# Patient Record
Sex: Female | Born: 1935 | Race: White | Hispanic: No | Marital: Single | Smoking: Never smoker
Health system: Southern US, Community
[De-identification: ages and names within clinical notes are randomized; demographics above are authoritative.]

## PROBLEM LIST (undated history)

## (undated) DIAGNOSIS — F0390 Unspecified dementia without behavioral disturbance: Secondary | ICD-10-CM

## (undated) DIAGNOSIS — F419 Anxiety disorder, unspecified: Secondary | ICD-10-CM

## (undated) DIAGNOSIS — I10 Essential (primary) hypertension: Secondary | ICD-10-CM

---

## 2015-06-28 ENCOUNTER — Inpatient Hospital Stay
Admission: EM | Admit: 2015-06-28 | Discharge: 2015-06-30 | DRG: 689 | Disposition: A | Discharge status: Skilled Nursing Facility | Payer: Commercial Managed Care - HMO | Attending: Internal Medicine | Admitting: Internal Medicine

## 2015-06-28 DIAGNOSIS — R296 Repeated falls: Secondary | ICD-10-CM | POA: Diagnosis present

## 2015-06-28 DIAGNOSIS — R410 Disorientation, unspecified: Secondary | ICD-10-CM

## 2015-06-28 DIAGNOSIS — G9340 Encephalopathy, unspecified: Secondary | ICD-10-CM | POA: Diagnosis present

## 2015-06-28 DIAGNOSIS — N390 Urinary tract infection, site not specified: Principal | ICD-10-CM | POA: Diagnosis present

## 2015-06-28 DIAGNOSIS — Z79899 Other long term (current) drug therapy: Secondary | ICD-10-CM

## 2015-06-28 DIAGNOSIS — N3000 Acute cystitis without hematuria: Secondary | ICD-10-CM

## 2015-06-28 DIAGNOSIS — W19XXXA Unspecified fall, initial encounter: Secondary | ICD-10-CM | POA: Diagnosis present

## 2015-06-28 DIAGNOSIS — R451 Restlessness and agitation: Secondary | ICD-10-CM

## 2015-06-28 DIAGNOSIS — Z66 Do not resuscitate: Secondary | ICD-10-CM | POA: Diagnosis present

## 2015-06-28 DIAGNOSIS — I10 Essential (primary) hypertension: Secondary | ICD-10-CM | POA: Diagnosis present

## 2015-06-28 DIAGNOSIS — F419 Anxiety disorder, unspecified: Secondary | ICD-10-CM | POA: Diagnosis present

## 2015-06-28 HISTORY — DX: Essential (primary) hypertension: I10

## 2015-06-28 HISTORY — DX: Anxiety disorder, unspecified: F41.9

## 2015-06-28 LAB — GLUCOSE, CAPILLARY: Glucose-Capillary: 95 mg/dL (ref 65–99)
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
## 2015-06-29 ENCOUNTER — Encounter: Payer: Self-pay | Admitting: Internal Medicine

## 2015-06-29 ENCOUNTER — Emergency Department: Payer: Commercial Managed Care - HMO

## 2015-06-29 DIAGNOSIS — Z79899 Other long term (current) drug therapy: Secondary | ICD-10-CM | POA: Diagnosis not present

## 2015-06-29 DIAGNOSIS — R296 Repeated falls: Secondary | ICD-10-CM | POA: Diagnosis present

## 2015-06-29 DIAGNOSIS — N390 Urinary tract infection, site not specified: Secondary | ICD-10-CM | POA: Diagnosis present

## 2015-06-29 DIAGNOSIS — F419 Anxiety disorder, unspecified: Secondary | ICD-10-CM | POA: Diagnosis present

## 2015-06-29 DIAGNOSIS — I10 Essential (primary) hypertension: Secondary | ICD-10-CM | POA: Diagnosis present

## 2015-06-29 DIAGNOSIS — G9340 Encephalopathy, unspecified: Secondary | ICD-10-CM | POA: Diagnosis present

## 2015-06-29 DIAGNOSIS — W19XXXA Unspecified fall, initial encounter: Secondary | ICD-10-CM | POA: Diagnosis present

## 2015-06-29 DIAGNOSIS — R410 Disorientation, unspecified: Secondary | ICD-10-CM | POA: Diagnosis present

## 2015-06-29 DIAGNOSIS — Z66 Do not resuscitate: Secondary | ICD-10-CM | POA: Diagnosis present

## 2015-06-29 LAB — URINALYSIS COMPLETE WITH MICROSCOPIC (ARMC ONLY)
Bilirubin Urine: NEGATIVE
Glucose, UA: NEGATIVE mg/dL
Hgb urine dipstick: NEGATIVE
Ketones, ur: NEGATIVE mg/dL
Leukocytes, UA: NEGATIVE
Nitrite: NEGATIVE
PH: 8 (ref 5.0–8.0)
PROTEIN: NEGATIVE mg/dL
SQUAMOUS EPITHELIAL / LPF: NONE SEEN
Specific Gravity, Urine: 1.01 (ref 1.005–1.030)

## 2015-06-29 LAB — BASIC METABOLIC PANEL
Anion gap: 6 (ref 5–15)
BUN: 13 mg/dL (ref 6–20)
CHLORIDE: 100 mmol/L — AB (ref 101–111)
CO2: 29 mmol/L (ref 22–32)
CREATININE: 0.85 mg/dL (ref 0.44–1.00)
Calcium: 8.8 mg/dL — ABNORMAL LOW (ref 8.9–10.3)
GFR calc Af Amer: >60 mL/min (ref >60)
GFR calc non Af Amer: 56 mL/min — ABNORMAL LOW (ref >60)
GLUCOSE: 109 mg/dL — AB (ref 65–99)
Potassium: 3.5 mmol/L (ref 3.5–5.1)
SODIUM: 135 mmol/L (ref 135–145)

## 2015-06-29 LAB — CBC
HCT: 34.9 % — ABNORMAL LOW (ref 35.0–47.0)
HCT: 39.4 % (ref 35.0–47.0)
HEMOGLOBIN: 12.8 g/dL (ref 12.0–16.0)
Hemoglobin: 11.7 g/dL — ABNORMAL LOW (ref 12.0–16.0)
MCH: 27.4 pg (ref 26.0–34.0)
MCH: 28.5 pg (ref 26.0–34.0)
MCHC: 32.3 g/dL (ref 32.0–36.0)
MCHC: 33.5 g/dL (ref 32.0–36.0)
MCV: 84.8 fL (ref 80.0–100.0)
MCV: 84.8 fL (ref 80.0–100.0)
PLATELETS: 230 10*3/uL (ref 150–440)
Platelets: 294 10*3/uL (ref 150–440)
RBC: 4.12 MIL/uL (ref 3.80–5.20)
RBC: 4.65 MIL/uL (ref 3.80–5.20)
RDW: 13.6 % (ref 11.5–14.5)
RDW: 13.8 % (ref 11.5–14.5)
WBC: 13.3 10*3/uL — ABNORMAL HIGH (ref 3.6–11.0)
WBC: 7.9 10*3/uL (ref 3.6–11.0)

## 2015-06-29 LAB — COMPREHENSIVE METABOLIC PANEL
ALBUMIN: 4.7 g/dL (ref 3.5–5.0)
ALT: 11 U/L — AB (ref 14–54)
AST: 33 U/L (ref 15–41)
Alkaline Phosphatase: 71 U/L (ref 38–126)
Anion gap: 8 (ref 5–15)
BUN: 15 mg/dL (ref 6–20)
CHLORIDE: 100 mmol/L — AB (ref 101–111)
CO2: 26 mmol/L (ref 22–32)
CREATININE: 0.75 mg/dL (ref 0.44–1.00)
Calcium: 9.4 mg/dL (ref 8.9–10.3)
GFR calc non Af Amer: >60 mL/min (ref >60)
Glucose, Bld: 115 mg/dL — ABNORMAL HIGH (ref 65–99)
Potassium: 4.1 mmol/L (ref 3.5–5.1)
SODIUM: 134 mmol/L — AB (ref 135–145)
Total Bilirubin: 0.7 mg/dL (ref 0.3–1.2)
Total Protein: 8 g/dL (ref 6.5–8.1)

## 2015-06-29 LAB — AMMONIA: Ammonia: 20 umol/L (ref 9–35)

## 2015-06-29 LAB — LACTIC ACID, PLASMA
LACTIC ACID, VENOUS: 1.2 mmol/L (ref 0.5–2.0)
Lactic Acid, Venous: 2 mmol/L (ref 0.5–2.0)

## 2015-06-29 MED ORDER — DEXTROSE 5 % IV SOLN
1.0000 g | INTRAVENOUS | Status: DC
  Administered 2015-06-30: 1 g via INTRAVENOUS
  Filled 2015-06-29 (×2): qty 10

## 2015-06-29 MED ORDER — SODIUM CHLORIDE 0.9 % IV SOLN
INTRAVENOUS | Status: AC
  Administered 2015-06-29: 05:00:00 via INTRAVENOUS

## 2015-06-29 MED ORDER — ENOXAPARIN SODIUM 30 MG/0.3ML ~~LOC~~ SOLN
30.0000 mg | SUBCUTANEOUS | Status: DC
Start: 2015-06-29 — End: 2015-06-30
  Administered 2015-06-30: 30 mg via SUBCUTANEOUS
  Filled 2015-06-29: qty 0.3

## 2015-06-29 MED ORDER — LORAZEPAM 2 MG/ML IJ SOLN
INTRAMUSCULAR | Status: AC
  Administered 2015-06-29: 2 mg
  Filled 2015-06-29: qty 1

## 2015-06-29 MED ORDER — SODIUM CHLORIDE 0.9 % IJ SOLN
3.0000 mL | Freq: Two times a day (BID) | INTRAMUSCULAR | Status: DC
  Administered 2015-06-30: 3 mL via INTRAVENOUS

## 2015-06-29 MED ORDER — HALOPERIDOL LACTATE 5 MG/ML IJ SOLN
INTRAMUSCULAR | Status: AC
  Administered 2015-06-29: 5 mg
  Filled 2015-06-29: qty 1

## 2015-06-29 MED ORDER — ACETAMINOPHEN 650 MG RE SUPP: 650.0000 mg | Freq: Four times a day (QID) | RECTAL | Status: DC | PRN

## 2015-06-29 MED ORDER — DEXTROSE 5 % IV SOLN
1.0000 g | Freq: Once | INTRAVENOUS | Status: AC
  Administered 2015-06-29: 1 g via INTRAVENOUS
  Filled 2015-06-29: qty 10

## 2015-06-29 MED ORDER — ONDANSETRON HCL 4 MG PO TABS: 4.0000 mg | ORAL_TABLET | Freq: Four times a day (QID) | ORAL | Status: DC | PRN

## 2015-06-29 MED ORDER — ONDANSETRON HCL 4 MG/2ML IJ SOLN: 4.0000 mg | Freq: Four times a day (QID) | INTRAMUSCULAR | Status: DC | PRN

## 2015-06-29 MED ORDER — ACETAMINOPHEN 325 MG PO TABS: 650.0000 mg | ORAL_TABLET | Freq: Four times a day (QID) | ORAL | Status: DC | PRN

## 2015-06-29 NOTE — Progress Notes (Signed)
[facility]   PATIENT NAME: Debra Peck    MR#:  [id number]  DATE OF BIRTH:  1916-11-17  SUBJECTIVE:  Son at bedside. Patient is currently lethargic. She is not arousable. Vital signs stable. Opens eyes to verbal commands temporarily  REVIEW OF SYSTEMS:   Review of Systems  Unable to perform ROS: mental acuity   Tolerating Diet: No Tolerating PT: Pending  DRUG ALLERGIES:  No Known Allergies  VITALS:  Blood pressure 125/45, pulse 61, temperature 97.9 F (36.6 C), temperature source Axillary, resp. rate 18, weight 96 lb 11.2 oz (43.863 kg), SpO2 100 %.  PHYSICAL EXAMINATION:   Physical Exam  GENERAL:  81 y.o.-year-old patient lying in the bed with no acute distress.  EYES: Pupils equal, round, reactive to light and accommodation. No scleral icterus. Extraocular muscles intact.  HEENT: Head atraumatic, normocephalic. Oropharynx and nasopharynx clear.  NECK:  Supple, no jugular venous distention. No thyroid enlargement, no tenderness.  LUNGS: Normal breath sounds bilaterally, no wheezing, rales, rhonchi. No use of accessory muscles of respiration.  CARDIOVASCULAR: S1, S2 normal. No murmurs, rubs, or gallops.  ABDOMEN: Soft, nontender, nondistended. Bowel sounds present. No organomegaly or mass.  EXTREMITIES: No cyanosis, clubbing or edema b/l.    NEUROLOGIC: Unable to assess patient lethargic Mohs all extremities well spontaneously  PSYCHIATRIC: Lethargic  SKIN: No obvious rash, lesion, or ulcer.    LABORATORY PANEL:   CBC  Recent Labs Lab 06/29/15 0459  WBC 7.9  HGB 11.7*  HCT 34.9*  PLT 230    Chemistries   Recent Labs Lab 06/29/15 0015 06/29/15 0459  NA 134* 135  K 4.1 3.5  CL 100* 100*  CO2 26 29  GLUCOSE 115* 109*  BUN 15 13  CREATININE 0.75 0.85  CALCIUM 9.4 8.8*  AST 33  --   ALT 11*  --   ALKPHOS 71  --   BILITOT 0.7  --     Cardiac Enzymes No results for input(s): TROPONINI in the last  168 hours.  RADIOLOGY:  Dg Chest 1 View  06/29/2015  CLINICAL DATA:  Altered mental status.  Unable to obtain history. EXAM: CHEST 1 VIEW COMPARISON:  None. FINDINGS: Normal heart size and pulmonary vascularity. Calcified and tortuous aorta. Mediastinal contours appear intact. Emphysematous changes in the lungs. Fibrosis in the lung bases. Calcified granulomas in the right lung base. No focal airspace disease or consolidation. No blunting of costophrenic angles. No pneumothorax. IMPRESSION: Emphysematous changes and fibrosis in the lungs. No evidence of active pulmonary disease. Electronically Signed   By: Burman NievesWilliam  Stevens M.D.   On: 06/29/2015 01:17   Ct Head Wo Contrast  06/29/2015  CLINICAL DATA:  Found on bedroom floor. Unwitnessed fall. Concern for head injury. Initial encounter. EXAM: CT HEAD WITHOUT CONTRAST TECHNIQUE: Contiguous axial images were obtained from the base of the skull through the vertex without intravenous contrast. COMPARISON:  None. FINDINGS: There is no evidence of acute infarction, mass lesion, or intra- or extra-axial hemorrhage on CT. Prominence of the ventricles and sulci reflects mild to moderate cortical volume loss. Mild cerebellar atrophy is noted. Scattered periventricular and subcortical white matter change likely reflects small vessel ischemic microangiopathy. The brainstem and fourth ventricle are within normal limits. The basal ganglia are unremarkable in appearance. The cerebral hemispheres demonstrate grossly normal gray-white differentiation. No mass effect or midline shift is seen. There is no evidence of fracture; a densely calcified 2.1 cm mass at the right posterior fossa  may reflect a calcified meningioma. The orbits are within normal limits. The paranasal sinuses and mastoid air cells are well-aerated. No significant soft tissue abnormalities are seen. IMPRESSION: 1. No evidence of traumatic intracranial injury or fracture. 2. Mild to moderate cortical volume  loss and scattered small vessel ischemic microangiopathy. 3. Densely calcified 2.1 cm mass at the right posterior fossa may reflect a calcified meningioma. Electronically Signed   By: Roanna Raider M.D.   On: 06/29/2015 03:15     ASSESSMENT AND PLAN:  Debra Peck is a 81 year old Caucasian female with history of anxiety/paranoia, hypertension comes in from [facility] independently living after she was found confused.   1. Acute encephalopathy - son states that the patient has not been eating much lately. Exact etiology of her encephalopathy is unclear at this time.  -IV fluids empirically on IV Rocephin for possible UTI urine culture pending -Vitals are stable  2.  HTN (hypertension) - at goal here. Continue to monitor. Will not restart home meds at this time the patient is able to reliably take by mouth. If her blood pressure rises we can use IV when necessary antihypertensives.  3.  chronic Anxiety - was on Paxil by her PCP for the same, but was not taking it reliably are consistently for the patient's son. Per patient's son patient recently has been getting meropenem very paranoid. She stays in her room at the independent living facility and does not go down stress with the dining area to eat. She has been having a feeling somebody stealing stuff and so keeps herself in the room. She does not have a psychiatrist who she follows with. We'll have psych see patient in a.m.  4. Falls - unclear etiology. CT head negative, precluding a thought of any cerebral bleed.  5. discharge planning social worker consult PT consult   Case discussed with Care Management/Social Worker. Management plans discussed with the  family and they are in agreement.  CODE STATUS: DO NOT RESUSCITATE according to the son  DVT Prophylaxis:Lovenox  TOTAL TIME TAKING CARE OF THIS PATIENT: 30  minutes.  >50% time spent on counselling and coordination of care  POSSIBLE D/C IN  1-2  DAYS, DEPENDING ON CLINICAL  CONDITION.   Debra Peck M.D on 06/29/2015 at 2:14 PM  Between 7am to 6pm - Pager - [phone]  After 6pm go to [email or web address] - password [email or web address] [facility]  [facility]  Office  [phone]  CC: Primary care physician; [facility]

## 2015-06-29 NOTE — ED Notes (Signed)
Report given to [facility]. [facility] she is trying to locate an off unit telemetry box before transfer to the floor.
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
## 2015-06-29 NOTE — H&P (Signed)
[facility] [place] at [facility]   PATIENT NAME: Debra Peck    MR#:  [id number]  DATE OF BIRTH:  1916/12/23  DATE OF ADMISSION:  06/28/2015  PRIMARY CARE PHYSICIAN: [facility]   REQUESTING/REFERRING PHYSICIAN: Carollee Massed, M.D.  CHIEF COMPLAINT:   Chief Complaint  Patient presents with  . Altered Mental Status    HISTORY OF PRESENT ILLNESS:  Debra Peck  is a 81 y.o. female who presents with acute encephalopathy. Patient was found naked in her bedroom after her downstairs neighbor complained of large or pink. Ceiling. Patient had left water running over flying in her sink in her bathroom. She is unable to contribute to her history due to her altered mental state, and her son provides collateral information. He is present with her in the ED today. He states that the patient told him that she was waiting in the bathroom and then fell and was able to get back to her room. This information is questionable given the patient's confusion and altered mental state. Initial workup in the ED was largely unremarkable. Patient has very mild leukocytosis with a white count at 13, and UA did not look suspicious for UTI except for the presence of some bacteria, but this is an absence of leukocyte esterase or nitrites or white cells. CT head did not show any significant findings. Hospitalists were called for admission for acute encephalopathy.  PAST MEDICAL HISTORY:   Past Medical History  Diagnosis Date  . HTN (hypertension)   . Anxiety     PAST SURGICAL HISTORY:  No past surgical history on file.  SOCIAL HISTORY:   Social History  Substance Use Topics  . Smoking status: Never Smoker   . Smokeless tobacco: Not on file  . Alcohol Use: No    FAMILY HISTORY:  No family history on file.  DRUG ALLERGIES:  No Known Allergies  MEDICATIONS AT HOME:   Prior to Admission medications   Medication Sig Start Date End Date Taking? Authorizing Provider   amLODipine (NORVASC) 2.5 MG tablet Take 2.5 mg by mouth daily.   Yes Historical Provider, MD    REVIEW OF SYSTEMS:  Review of Systems  Constitutional: Negative for fever, chills, weight loss and malaise/fatigue.  HENT: Negative for ear pain, hearing loss and tinnitus.   Eyes: Negative for blurred vision, double vision, pain and redness.  Respiratory: Negative for cough, hemoptysis and shortness of breath.   Cardiovascular: Negative for chest pain, palpitations, orthopnea and leg swelling.  Gastrointestinal: Negative for nausea, vomiting, abdominal pain, diarrhea and constipation.  Genitourinary: Negative for dysuria, frequency and hematuria.  Musculoskeletal: Positive for falls. Negative for back pain, joint pain and neck pain.  Skin:       No acne, rash, or lesions  Neurological: Negative for dizziness, tremors, focal weakness and weakness.       Acute encephalopathy  Endo/Heme/Allergies: Negative for polydipsia. Does not bruise/bleed easily.  Psychiatric/Behavioral: Negative for depression. The patient is not nervous/anxious and does not have insomnia.      VITAL SIGNS:   Filed Vitals:   06/29/15 0130 06/29/15 0145 06/29/15 0200 06/29/15 0230  BP: 115/53  106/56 118/61  Pulse: 69 72 65 66  Temp:      TempSrc:      Resp:   18 16  SpO2: 98% 95% 98% 97%   Wt Readings from Last 3 Encounters:  No data found for Wt    PHYSICAL EXAMINATION:  Physical Exam  Vitals reviewed.  Constitutional: She is oriented to person, place, and time. She appears well-developed. No distress.  Very thin  HENT:  Head: Normocephalic and atraumatic.  Mouth/Throat: Oropharynx is clear and moist.  Eyes: Conjunctivae and EOM are normal. Pupils are equal, round, and reactive to light. No scleral icterus.  Neck: Normal range of motion. Neck supple. No JVD present. No thyromegaly present.  Cardiovascular: Normal rate, regular rhythm and intact distal pulses.  Exam reveals no gallop and no friction rub.    No murmur heard. Respiratory: Effort normal and breath sounds normal. No respiratory distress. She has no wheezes. She has no rales.  GI: Soft. Bowel sounds are normal. She exhibits no distension. There is no tenderness.  Musculoskeletal: Normal range of motion. She exhibits no edema.  No arthritis, no gout  Lymphadenopathy:    She has no cervical adenopathy.  Neurological: She is alert and oriented to person, place, and time. No cranial nerve deficit.  No dysarthria, no aphasia  Skin: Skin is warm and dry. No rash noted. No erythema.  Psychiatric: She has a normal mood and affect. Her behavior is normal. Judgment and thought content normal.    LABORATORY PANEL:   CBC  Recent Labs Lab 06/29/15 0015  WBC 13.3*  HGB 12.8  HCT 39.4  PLT 294   ------------------------------------------------------------------------------------------------------------------  Chemistries   Recent Labs Lab 06/29/15 0015  NA 134*  K 4.1  CL 100*  CO2 26  GLUCOSE 115*  BUN 15  CREATININE 0.75  CALCIUM 9.4  AST 33  ALT 11*  ALKPHOS 71  BILITOT 0.7   ------------------------------------------------------------------------------------------------------------------  Cardiac Enzymes No results for input(s): TROPONINI in the last 168 hours. ------------------------------------------------------------------------------------------------------------------  RADIOLOGY:  Dg Chest 1 View  06/29/2015  CLINICAL DATA:  Altered mental status.  Unable to obtain history. EXAM: CHEST 1 VIEW COMPARISON:  None. FINDINGS: Normal heart size and pulmonary vascularity. Calcified and tortuous aorta. Mediastinal contours appear intact. Emphysematous changes in the lungs. Fibrosis in the lung bases. Calcified granulomas in the right lung base. No focal airspace disease or consolidation. No blunting of costophrenic angles. No pneumothorax. IMPRESSION: Emphysematous changes and fibrosis in the lungs. No evidence of  active pulmonary disease. Electronically Signed   By: Burman NievesWilliam  Stevens M.D.   On: 06/29/2015 01:17   Ct Head Wo Contrast  06/29/2015  CLINICAL DATA:  Found on bedroom floor. Unwitnessed fall. Concern for head injury. Initial encounter. EXAM: CT HEAD WITHOUT CONTRAST TECHNIQUE: Contiguous axial images were obtained from the base of the skull through the vertex without intravenous contrast. COMPARISON:  None. FINDINGS: There is no evidence of acute infarction, mass lesion, or intra- or extra-axial hemorrhage on CT. Prominence of the ventricles and sulci reflects mild to moderate cortical volume loss. Mild cerebellar atrophy is noted. Scattered periventricular and subcortical white matter change likely reflects small vessel ischemic microangiopathy. The brainstem and fourth ventricle are within normal limits. The basal ganglia are unremarkable in appearance. The cerebral hemispheres demonstrate grossly normal gray-white differentiation. No mass effect or midline shift is seen. There is no evidence of fracture; a densely calcified 2.1 cm mass at the right posterior fossa may reflect a calcified meningioma. The orbits are within normal limits. The paranasal sinuses and mastoid air cells are well-aerated. No significant soft tissue abnormalities are seen. IMPRESSION: 1. No evidence of traumatic intracranial injury or fracture. 2. Mild to moderate cortical volume loss and scattered small vessel ischemic microangiopathy. 3. Densely calcified 2.1 cm mass at the right posterior fossa may reflect  a calcified meningioma. Electronically Signed   By: Roanna Raider M.D.   On: 06/29/2015 03:15    EKG:   Orders placed or performed during the hospital encounter of 06/28/15  . EKG 12-Lead  . EKG 12-Lead    IMPRESSION AND PLAN:  Principal Problem:   Acute encephalopathy - son states that the patient has not been eating much lately. Exact etiology of her encephalopathy is unclear at this time. UTIs not entirely outside  of the [place] possibility, though seems less likely given her UA findings. Urine culture was sent. Active Problems:   HTN (hypertension) - at goal here. Continue to monitor. Will not restart home meds at this time the patient is able to reliably take by mouth. If her blood pressure rises we can use IV when necessary antihypertensives.   Anxiety - was on Paxil by her PCP for the same, but was not taking it reliably are consistently for the patient's son.   Falls - unclear etiology. CT head negative, precluding a thought of any cerebral bleed.  All the records are reviewed and case discussed with ED provider. Management plans discussed with the patient and/or family.  DVT PROPHYLAXIS: SubQ lovenox  ADMISSION STATUS: Inpatient  CODE STATUS: DO NOT RESUSCITATE  TOTAL TIME TAKING CARE OF THIS PATIENT: 50 minutes.    Candiace West FIELDING 06/29/2015, 3:22 AM  [facility]  Office  [phone]  CC: Primary care physician; [facility]

## 2015-06-29 NOTE — Care Management Note (Signed)
Case Management Note  Patient Details  Name: Debra Peck MRN: [id number] Date of Birth: 24-Sep-1916  Subjective/Objective:   81yo Ms Debra Peck was admitted 06/28/15 with AMS from [facility]. Ms Debra Peck was verbally unresponsive but son was at beside to provide information. Son reports that Ms Debra Peck has become progressively more paranoid and is eating very little. He buys her groceries every week and very little is missing the following week. Ms Debra Peck also refuses to [facility] to eat meals. She weighs 96lbs. Son reports that he and his wife have been discussing a higher level of care for Ms Debra Peck but that Ms Debra Peck, who is verbal at her baseline, refuses to consider moving to an Assisted Living Facility. Ms Debra Peck accuses her daughter-in-law of stealing from her. Ms Debra Peck will not allow anyone to clean her apartment because she thinks they are stealing from her per son's report. Son requested to speak with someone about placement options and Jetta LoutBailey Morgan was updated about son's request. [facility]. Pharmacy=[facility] on [address]. Assistive equipment includes a cane and one hearing aide. No current home health services. No home oxygen. Son provides transportation to appointments. Case management will follow for discharge planning.                 Action/Plan:   Expected Discharge Date:                  Expected Discharge Plan:     In-House Referral:     Discharge planning Services     Post Acute Care Choice:    Choice offered to:     DME Arranged:    DME Agency:     HH Arranged:    HH Agency:     Status of Service:     Medicare Important Message Given:  Yes Date Medicare IM Given:    Medicare IM give by:    Date Additional Medicare IM Given:    Additional Medicare Important Message give by:     If discussed at Long Length of Stay Meetings, dates discussed:    Additional Comments:  Suzan Manon  A, RN 06/29/2015, 2:33 PM

## 2015-06-29 NOTE — Care Management Important Message (Signed)
Important Message  Patient Details  Name: Debra Peck MRN: [id number] Date of Birth: 09-18-1916   Medicare Important Message Given:  Yes    [name] A, RN 06/29/2015, 11:54 AM

## 2015-06-30 MED ORDER — POLYETHYLENE GLYCOL 3350 17 G PO PACK
17.0000 g | PACK | Freq: Every day | ORAL | Status: DC
  Administered 2015-06-30: 17 g via ORAL
  Filled 2015-06-30: qty 1

## 2015-06-30 MED ORDER — PAROXETINE HCL 10 MG PO TABS
10.0000 mg | ORAL_TABLET | Freq: Every day | ORAL | Status: DC
  Administered 2015-06-30: 10 mg via ORAL
  Filled 2015-06-30: qty 1

## 2015-06-30 MED ORDER — PAROXETINE HCL 10 MG PO TABS: 10.0000 mg | ORAL_TABLET | Freq: Every day | ORAL | Status: AC

## 2015-06-30 MED ORDER — CEPHALEXIN 500 MG PO CAPS: 500.0000 mg | ORAL_CAPSULE | Freq: Two times a day (BID) | ORAL | Status: DC

## 2015-06-30 NOTE — Progress Notes (Signed)
Clinical Child psychotherapistocial Worker (CSW) contacted patient's son [name] and presented bed offers. [facility]. Patient is medically stable for D/C to [facility] today. [facility] room C[facility]6 bed 2. [facility]. Auth # [id number]. RN will call report and patient's son [name] will provide transport. CSW sent D/C Summary, D/C packet and FL2 to [facility] via [facility]. DNR form was given to son to [facility] to [facility]. Son [name] was at bedside and aware of D/C today. Please reconsult if future social work needs arise. CSW signing off.   Jetta LoutBailey Morgan, LCSWA [phone]

## 2015-06-30 NOTE — NC FL2 (Signed)
   MEDICAID FL2 LEVEL OF CARE SCREENING TOOL     IDENTIFICATION  Patient Name: Debra Peck Birthdate: 1917/06/21 Sex: female Admission Date (Current Location): 06/28/2015  [place]  [facility]   Facility and Address:  [facility], [address], [place], [place] [address]      Provider Number: [id number]  Attending Physician Name and Address:  Enedina FinnerSona Nesreen Albano, MD  Relative Name and Phone Number:       Current Level of Care: Hospital Recommended Level of Care: Skilled Nursing Facility Prior Approval Number:    Date Approved/Denied:   PASRR Number:  [id number] [id number])  Discharge Plan: SNF    Current Diagnoses: Patient Active Problem List   Diagnosis Date Noted  . Acute encephalopathy 06/29/2015  . HTN (hypertension) 06/29/2015  . Anxiety 06/29/2015  . Falls 06/29/2015    Orientation ACTIVITIES/SOCIAL BLADDER RESPIRATION    Self  Passive Continent Normal  BEHAVIORAL SYMPTOMS/MOOD NEUROLOGICAL BOWEL NUTRITION STATUS   (none )  (none ) Continent Diet (Diet: Soft )  PHYSICIAN VISITS COMMUNICATION OF NEEDS Height & Weight Skin  30 days Verbally   96 lbs. Normal          AMBULATORY STATUS RESPIRATION    Assist extensive Normal      Personal Care Assistance Level of Assistance  Bathing, Feeding, Dressing Bathing Assistance: Limited assistance Feeding assistance: Limited assistance Dressing Assistance: Limited assistance      Functional Limitations Info  Sight, Hearing, Speech Sight Info: Adequate Hearing Info: Impaired Speech Info: Adequate       SPECIAL CARE FACTORS FREQUENCY  PT (By licensed PT)     PT Frequency:  (5)             Additional Factors Info  Code Status Code Status Info:  (DNR)             Current Medications (06/30/2015):  This is the current hospital active medication list Current Facility-Administered Medications  Medication Dose Route Frequency Provider Last  Rate Last Dose  . acetaminophen (TYLENOL) tablet 650 mg  650 mg Oral Q6H PRN Oralia Manisavid Willis, MD       Or  . acetaminophen (TYLENOL) suppository 650 mg  650 mg Rectal Q6H PRN Oralia Manisavid Willis, MD      . cefTRIAXone (ROCEPHIN) 1 g in dextrose 5 % 50 mL IVPB  1 g Intravenous Q24H Oralia Manisavid Willis, MD   1 g at 06/30/15 0248  . enoxaparin (LOVENOX) injection 30 mg  30 mg Subcutaneous Q24H Oralia Manisavid Willis, MD   30 mg at 06/30/15 0550  . ondansetron (ZOFRAN) tablet 4 mg  4 mg Oral Q6H PRN Oralia Manisavid Willis, MD       Or  . ondansetron [facility](ZOFRAN) injection 4 mg  4 mg Intravenous Q6H PRN Oralia Manisavid Willis, MD      . PARoxetine (PAXIL) tablet 10 mg  10 mg Oral Daily Enedina FinnerSona Eythan Jayne, MD      . polyethylene glycol (MIRALAX / GLYCOLAX) packet 17 g  17 g Oral Daily Enedina FinnerSona Zarie Kosiba, MD      . sodium chloride 0.9 % injection 3 mL  3 mL Intravenous Q12H Oralia Manisavid Willis, MD   3 mL at 06/30/15 1019     Discharge Medications: Please see discharge summary for a list of discharge medications.  Relevant Imaging Results:  Relevant Lab Results:  Recent Labs    Additional Information  (SSN: [id number])  Haig ProphetMorgan, Bailey G, LCSW

## 2015-06-30 NOTE — [place] FL2 (Signed)
  [place] MEDICAID FL2 LEVEL OF CARE SCREENING TOOL     IDENTIFICATION  Patient Name: Debra Peck Birthdate: [birth year] Sex: female Admission Date (Current Location): 06/28/2015  [place]  [facility]   Facility and Address:  [facility], [address], [place], [place] [address]      Provider Number: [id number]  Attending Physician Name and Address:  Enedina FinnerSona Brylan Dec, MD  Relative Name and Phone Number:       Current Level of Care: Hospital Recommended Level of Care: Skilled Nursing Facility Prior Approval Number:    Date Approved/Denied:   PASRR Number:  [id number])  Discharge Plan: SNF    Current Diagnoses: Patient Active Problem List   Diagnosis Date Noted  . Acute encephalopathy 06/29/2015  . HTN (hypertension) 06/29/2015  . Anxiety 06/29/2015  . Falls 06/29/2015    Orientation ACTIVITIES/SOCIAL BLADDER RESPIRATION    Self  Passive Continent Normal  BEHAVIORAL SYMPTOMS/MOOD NEUROLOGICAL BOWEL NUTRITION STATUS   (none )  (none ) Continent Diet (Diet: Soft )  PHYSICIAN VISITS COMMUNICATION OF NEEDS Height & Weight Skin  30 days Verbally   96 lbs. Normal          AMBULATORY STATUS RESPIRATION    Assist extensive Normal      Personal Care Assistance Level of Assistance  Bathing, Feeding, Dressing Bathing Assistance: Limited assistance Feeding assistance: Limited assistance Dressing Assistance: Limited assistance      Functional Limitations Info  Sight, Hearing, Speech Sight Info: Adequate Hearing Info: Impaired Speech Info: Adequate       SPECIAL CARE FACTORS FREQUENCY  PT (By licensed PT)     PT Frequency:  (5)             Additional Factors Info  Code Status Code Status Info:  (DNR)             Current Medications (06/30/2015):  This is the current hospital active medication list Current Facility-Administered Medications  Medication Dose Route Frequency Provider Last  Rate Last Dose  . acetaminophen (TYLENOL) tablet 650 mg  650 mg Oral Q6H PRN Oralia Manisavid Willis, MD       Or  . acetaminophen (TYLENOL) suppository 650 mg  650 mg Rectal Q6H PRN Oralia Manisavid Willis, MD      . cefTRIAXone (ROCEPHIN) 1 g in dextrose 5 % 50 mL IVPB  1 g Intravenous Q24H Oralia Manisavid Willis, MD   1 g at 06/30/15 0248  . enoxaparin (LOVENOX) injection 30 mg  30 mg Subcutaneous Q24H Oralia Manisavid Willis, MD   30 mg at 06/30/15 0550  . ondansetron (ZOFRAN) tablet 4 mg  4 mg Oral Q6H PRN Oralia Manisavid Willis, MD       Or  . ondansetron [facility](ZOFRAN) injection 4 mg  4 mg Intravenous Q6H PRN Oralia Manisavid Willis, MD      . sodium chloride 0.9 % injection 3 mL  3 mL Intravenous Q12H Oralia Manisavid Willis, MD   3 mL at 06/30/15 1019     Discharge Medications: Please see discharge summary for a list of discharge medications.  Relevant Imaging Results:  Relevant Lab Results:  Recent Labs    Additional Information  (SSN: [id number])  Haig ProphetMorgan, Bailey G, LCSW

## 2015-06-30 NOTE — Clinical Social Work Placement (Signed)
   CLINICAL SOCIAL WORK PLACEMENT  NOTE  Date:  06/30/2015  Patient Details  Name: Debra Peck MRN: [id number] Date of Birth: 1917/03/24  Clinical Social Work is seeking post-discharge placement for this patient at the Skilled  Nursing Facility level of care (*CSW will initial, date and re-position this form in  chart as items are completed):  Yes   Patient/family provided with [place] Clinical Social Work Department's list of facilities offering this level of care within the geographic area requested by the patient (or if unable, by the patient's family).  Yes   Patient/family informed of their freedom to choose among providers that offer the needed level of care, that participate in Medicare, Medicaid or managed care program needed by the patient, have an available bed and are willing to accept the patient.  Yes   Patient/family informed of [facility]'s ownership interest in [facility] and [facility], as well as of the fact that they are under no obligation to receive care at these facilities.  PASRR submitted to EDS on 06/30/15     PASRR number received on 06/30/15     Existing PASRR number confirmed on       FL2 transmitted to all facilities in geographic area requested by pt/family on 06/30/15     FL2 transmitted to all facilities within larger geographic area on       Patient informed that his/her managed care company has contracts with or will negotiate with certain facilities, including the following:            Patient/family informed of bed offers received.  Patient chooses bed at       Physician recommends and patient chooses bed at      Patient to be transferred to   on  .  Patient to be transferred to facility by       Patient family notified on   of transfer.  Name of family member notified:        PHYSICIAN       Additional Comment:    _______________________________________________ Haig ProphetMorgan, Elis Rawlinson G, LCSW 06/30/2015, 12:03 PM

## 2015-06-30 NOTE — Evaluation (Signed)
Physical Therapy Evaluation Patient Details Name: Debra Peck MRN: [id number] DOB: 1917/02/12 Today's Date: 06/30/2015   History of Present Illness  Pt is a 81 y.o. female who was admitted with acute encephalopathy (pt found naked in bedroom with bathroom sink running).  [facility].  Clinical Impression  Pt oriented to self only and appearing confused during session.  Pt having significant difficulty putting in her hearing aid and intermittently taking it out during session and looking for the hearing aide battery (PT showed pt the battery but pt then trying to pulling out IV and needing max redirection).  Pt appearing impulsive with activity but pt still HOH complicating sessions activities.  Pt ambulated with hand hold assist of 2 to doorway but pt hesitating at doorway and unable to step through door area (floor tile changed color) and then pt walking backwards away from it.  Pt does not appear to normally use a RW (pt had significant difficulty attempting to use it with max cueing) and requiring 2 assist for safety with functional mobility d/t cognitive impairments and balance impairments.  Pt does not appear safe to discharge back to Independent Living Facility d/t above noted concerns.  Recommend pt discharge to STR when medically appropriate.    Follow Up Recommendations SNF    Equipment Recommendations   (TBD)    Recommendations for Other Services       Precautions / Restrictions Precautions Precautions: Fall Restrictions Weight Bearing Restrictions: No      Mobility  Bed Mobility Overal bed mobility: Needs Assistance Bed Mobility: Supine to Sit     Supine to sit: Min assist     General bed mobility comments: pt requiring vc's and visual cues to get out of bed (pt focusing on finding hearing aide battery and continuing trying to find it although therapist showed pt the battery and attempted to give pt the battery)  Transfers Overall  transfer level: Needs assistance Equipment used: 2 person hand held assist Transfers: Sit to/from Stand;Stand Pivot Transfers Sit to Stand: Min assist;+2 safety/equipment Stand pivot transfers: Min assist;+2 safety/equipment (transfer bed to commode)       General transfer comment: pt required vc's and visual cues for transfer  Ambulation/Gait Ambulation/Gait assistance: Min assist;+2 physical assistance;+2 safety/equipment Ambulation Distance (Feet): 20 Feet Assistive device: 2 person hand held assist   Gait velocity: decreased   General Gait Details: decreased B step length/foot clearance/heelstrike; attempted to use RW but pt putting both hands on R side of walker (pt unable to keep L hand on L side of walker even with cueing)--deferred use of RW d/t pt with significant difficulty trying to use it  Careers information officertairs            Wheelchair Mobility    Modified Rankin (Stroke Patients Only)       Balance Overall balance assessment: Needs assistance Sitting-balance support: No upper extremity supported;Feet supported Sitting balance-Leahy Scale: Fair     Standing balance support: Bilateral upper extremity supported (B hand hold assist) Standing balance-Leahy Scale: Poor                               Pertinent Vitals/Pain Pain Assessment: No/denies pain  Vitals stable and WFL throughout treatment session.    Home Living Family/patient expects to be discharged to:: Private residence Living Arrangements: Alone   Type of Home: Independent living facility [facility] Home Access: Level entry  Home Layout: One level Home Equipment:  (Per notes pt has cane and R hearing aide)      Prior Function Level of Independence: Independent         Comments: Per notes, pt with increased paranoia recently and refusing to go to dining hall.     Hand Dominance        Extremity/Trunk Assessment   Upper Extremity Assessment: Generalized  weakness           Lower Extremity Assessment: Generalized weakness         Communication   Communication: HOH (R hearing aide placed with assist)  Cognition Arousal/Alertness: Awake/alert Behavior During Therapy: Impulsive Overall Cognitive Status: No family/caregiver present to determine baseline cognitive functioning (Pt oriented to self only)                      General Comments   Nursing cleared pt for participation in physical therapy.  Pt agreeable to PT session.    Exercises        Assessment/Plan    PT Assessment Patient needs continued PT services  PT Diagnosis Difficulty walking;Generalized weakness   PT Problem List Decreased strength;Decreased activity tolerance;Decreased balance;Decreased mobility;Decreased cognition;Decreased safety awareness;Decreased knowledge of use of DME  PT Treatment Interventions DME instruction;Gait training;Functional mobility training;Therapeutic activities;Therapeutic exercise;Balance training;Patient/family education   PT Goals (Current goals can be found in the Care Plan section) Acute Rehab PT Goals Patient Stated Goal: to go to the bathroom PT Goal Formulation: With patient Time For Goal Achievement: 07/14/15 Potential to Achieve Goals: Good    Frequency Min 2X/week   Barriers to discharge Decreased caregiver support      Co-evaluation               End of Session Equipment Utilized During Treatment: Gait belt Activity Tolerance: Patient tolerated treatment well Patient left: in chair;with call bell/phone within reach;with chair alarm set Nurse Communication: Mobility status;Precautions         Time: 3086-5784 PT Time Calculation (min) (ACUTE ONLY): 35 min   Charges:   PT Evaluation $Initial PT Evaluation Tier I: 1 Procedure PT Treatments $Therapeutic Activity: 8-22 mins   PT G CodesHendricks Limes 2015-07-19, 11:12 AM Hendricks Limes, PT [phone]

## 2015-06-30 NOTE — Clinical Social Work Placement (Signed)
   CLINICAL SOCIAL WORK PLACEMENT  NOTE  Date:  06/30/2015  Patient Details  Name: Debra Birchwoodauline H Dockham MRN: [id number] Date of Birth: [birth year]  Clinical Social Work is seeking post-discharge placement for this patient at the Skilled  Nursing Facility level of care (*CSW will initial, date and re-position this form in  chart as items are completed):  Yes   Patient/family provided with [place] Clinical Social Work Department's list of facilities offering this level of care within the geographic area requested by the patient (or if unable, by the patient's family).  Yes   Patient/family informed of their freedom to choose among providers that offer the needed level of care, that participate in Medicare, Medicaid or managed care program needed by the patient, have an available bed and are willing to accept the patient.  Yes   Patient/family informed of [facility]'s ownership interest in [facility] and [facility], as well as of the fact that they are under no obligation to receive care at these facilities.  PASRR submitted to EDS on 06/30/15     PASRR number received on 06/30/15     Existing PASRR number confirmed on       FL2 transmitted to all facilities in geographic area requested by pt/family on 06/30/15     FL2 transmitted to all facilities within larger geographic area on       Patient informed that his/her managed care company has contracts with or will negotiate with certain facilities, including the following:        Yes   Patient/family informed of bed offers received.  Patient chooses bed at  [facility] )     Physician recommends and patient chooses bed at      Patient to be transferred to  [facility]([place] ) on 06/30/15.  Patient to be transferred to facility by  [facility](Son [name] will transport in private vehicle. )     Patient family notified on 06/30/15 of transfer.  Name of family member notified:   (Son [name] at bedside and aware of D/C today. )     PHYSICIAN    Additional Comment:    _______________________________________________ Haig ProphetMorgan, Jude Naclerio G, LCSW 06/30/2015, 3:00 PM

## 2015-06-30 NOTE — Discharge Instructions (Signed)
PT at rehab °

## 2015-06-30 NOTE — Care Management (Signed)
Patient is from [facility] independent living. I met with patient but she would not talk. She was sitting in recliner attempting to lean the chair back. I covered patient with blanket as she appeared cold. PT is recommending SNF. CSW attempting to reach patient's son to discuss. List of home health agencies left a patient's bedside.

## 2015-06-30 NOTE — Discharge Summary (Signed)
[facility] - [place] at [facility]   PATIENT NAME: Debra Peck    MR#:  [id number]  DATE OF BIRTH:  19-Jun-1917  DATE OF ADMISSION:  06/28/2015 ADMITTING PHYSICIAN: Oralia Manis, MD  DATE OF DISCHARGE: 06/30/15  PRIMARY CARE PHYSICIAN: [facility]    ADMISSION DIAGNOSIS:  Confusion [R41.0] Agitation [R45.1] Acute cystitis without hematuria [N30.00]  DISCHARGE DIAGNOSIS:  Acute encephalopathy-improving UTI Anxiety/h/o paranoia  SECONDARY DIAGNOSIS:   Past Medical History  Diagnosis Date  . HTN (hypertension)   . Anxiety     HOSPITAL COURSE:  Ms. Skoda is a 81 year old Caucasian female with history of anxiety/paranoia, hypertension comes in [facility] after she was found confused.  1. Acute encephalopathy - likely due to UTI and poor po intake -son states that the patient has not been eating much lately.  -received IV fluids empirically on IV Rocephin for possible UTI --->po kelfex -Vitals are stable  2. HTN (hypertension) - at goal here.  -on amlodipine  3. chronic Anxiety - was on Paxil by her PCP for the same, but was not taking it reliably are consistently for the patient's son. Per patient's son patient recently has been getting  very paranoid.  -Resumed her paxil  4. Falls - unclear etiology. CT head negative, precluding a thought of any cerebral bleed. -PT recommends rehab  5. discharge planning social worker consult---to rehab    DRUG ALLERGIES:  No Known Allergies  DISCHARGE MEDICATIONS:   Current Discharge Medication List    START taking these medications   Details  cephALEXin (KEFLEX) 500 MG capsule Take 1 capsule (500 mg total) by mouth 2 (two) times daily. Qty: 8 capsule, Refills: 0    PARoxetine (PAXIL) 10 MG tablet Take 1 tablet (10 mg total) by mouth daily. Qty: 30 tablet, Refills: 0      CONTINUE these medications which have NOT CHANGED   Details  amLODipine  (NORVASC) 2.5 MG tablet Take 2.5 mg by mouth daily.        If you experience worsening of your admission symptoms, develop shortness of breath, life threatening emergency, suicidal or homicidal thoughts you must seek medical attention immediately by calling 911 or calling your MD immediately  if symptoms less severe.  You Must read complete instructions/literature along with all the possible adverse reactions/side effects for all the Medicines you take and that have been prescribed to you. Take any new Medicines after you have completely understood and accept all the possible adverse reactions/side effects.   Please note  You were cared for by a hospitalist during your hospital stay. If you have any questions about your discharge medications or the care you received while you were in the hospital after you are discharged, you can call the unit and asked to speak with the hospitalist on call if the hospitalist that took care of you is not available. Once you are discharged, your primary care physician will handle any further medical issues. Please note that NO REFILLS for any discharge medications will be authorized once you are discharged, as it is imperative that you return to your primary care physician (or establish a relationship with a primary care physician if you do not have one) for your aftercare needs so that they can reassess your need for medications and monitor your lab values. Today   SUBJECTIVE  No complaints. Hard on hearing  VITAL SIGNS:  Blood pressure 123/66, pulse 73, temperature 98.7 F (37.1 C), temperature source  Axillary, resp. rate 18, weight 96 lb 11.2 oz (43.863 kg), SpO2 100 %.  I/O:   Intake/Output Summary (Last 24 hours) at 06/30/15 1342 Last data filed at 06/30/15 0746  Gross per 24 hour  Intake    170 ml  Output      0 ml  Net    170 ml    PHYSICAL EXAMINATION:  GENERAL:  81 y.o.-year-old patient lying in the bed with no acute distress. Thin,  cachectic EYES: Pupils equal, round, reactive to light and accommodation. No scleral icterus. Extraocular muscles intact.  HEENT: Head atraumatic, normocephalic. Oropharynx and nasopharynx clear.  NECK:  Supple, no jugular venous distention. No thyroid enlargement, no tenderness.  LUNGS: Normal breath sounds bilaterally, no wheezing, rales,rhonchi or crepitation. No use of accessory muscles of respiration.  CARDIOVASCULAR: S1, S2 normal. No murmurs, rubs, or gallops.  ABDOMEN: Soft, non-tender, non-distended. Bowel sounds present. No organomegaly or mass.  EXTREMITIES: No pedal edema, cyanosis, or clubbing.  NEUROLOGIC: unable to assess. Pt is grossly non focal PSYCHIATRIC: The patient is alert  SKIN: No obvious rash, lesion, or ulcer.   DATA REVIEW:   CBC   Recent Labs Lab 06/29/15 0459  WBC 7.9  HGB 11.7*  HCT 34.9*  PLT 230    Chemistries   Recent Labs Lab 06/29/15 0015 06/29/15 0459  NA 134* 135  K 4.1 3.5  CL 100* 100*  CO2 26 29  GLUCOSE 115* 109*  BUN 15 13  CREATININE 0.75 0.85  CALCIUM 9.4 8.8*  AST 33  --   ALT 11*  --   ALKPHOS 71  --   BILITOT 0.7  --     Microbiology Results   Recent Results (from the past 240 hour(s))  Urine culture     Status: None (Preliminary result)   Collection Time: 06/28/15  3:00 AM  Result Value Ref Range Status   Specimen Description URINE, RANDOM  Final   Special Requests NONE  Final   Culture   Final    >=100,000 COLONIES/mL GRAM NEGATIVE RODS IDENTIFICATION AND SUSCEPTIBILITIES TO FOLLOW    Report Status PENDING  Incomplete  Culture, blood (routine x 2)     Status: None (Preliminary result)   Collection Time: 06/29/15 12:16 AM  Result Value Ref Range Status   Specimen Description BLOOD LEFT ARM  Final   Special Requests BOTTLES DRAWN AEROBIC AND ANAEROBIC 4CC  Final   Culture NO GROWTH 1 DAY  Final   Report Status PENDING  Incomplete  Culture, blood (routine x 2)     Status: None (Preliminary result)    Collection Time: 06/29/15  2:23 AM  Result Value Ref Range Status   Specimen Description BLOOD RIGHT FOREARM  Final   Special Requests BOTTLES DRAWN AEROBIC AND ANAEROBIC  1CC  Final   Culture NO GROWTH < 24 HOURS  Final   Report Status PENDING  Incomplete    RADIOLOGY:  Dg Chest 1 View  06/29/2015  CLINICAL DATA:  Altered mental status.  Unable to obtain history. EXAM: CHEST 1 VIEW COMPARISON:  None. FINDINGS: Normal heart size and pulmonary vascularity. Calcified and tortuous aorta. Mediastinal contours appear intact. Emphysematous changes in the lungs. Fibrosis in the lung bases. Calcified granulomas in the right lung base. No focal airspace disease or consolidation. No blunting of costophrenic angles. No pneumothorax. IMPRESSION: Emphysematous changes and fibrosis in the lungs. No evidence of active pulmonary disease. Electronically Signed   By: Burman Nieves M.D.   On: 06/29/2015  01:17   Ct Head Wo Contrast  06/29/2015  CLINICAL DATA:  Found on bedroom floor. Unwitnessed fall. Concern for head injury. Initial encounter. EXAM: CT HEAD WITHOUT CONTRAST TECHNIQUE: Contiguous axial images were obtained from the base of the skull through the vertex without intravenous contrast. COMPARISON:  None. FINDINGS: There is no evidence of acute infarction, mass lesion, or intra- or extra-axial hemorrhage on CT. Prominence of the ventricles and sulci reflects mild to moderate cortical volume loss. Mild cerebellar atrophy is noted. Scattered periventricular and subcortical white matter change likely reflects small vessel ischemic microangiopathy. The brainstem and fourth ventricle are within normal limits. The basal ganglia are unremarkable in appearance. The cerebral hemispheres demonstrate grossly normal gray-white differentiation. No mass effect or midline shift is seen. There is no evidence of fracture; a densely calcified 2.1 cm mass at the right posterior fossa may reflect a calcified meningioma. The  orbits are within normal limits. The paranasal sinuses and mastoid air cells are well-aerated. No significant soft tissue abnormalities are seen. IMPRESSION: 1. No evidence of traumatic intracranial injury or fracture. 2. Mild to moderate cortical volume loss and scattered small vessel ischemic microangiopathy. 3. Densely calcified 2.1 cm mass at the right posterior fossa may reflect a calcified meningioma. Electronically Signed   By: Roanna RaiderJeffery  Chang M.D.   On: 06/29/2015 03:15     Management plans discussed with the patient, family and they are in agreement.  CODE STATUS:     Code Status Orders        Start     Ordered   06/29/15 0446  Do not attempt resuscitation (DNR)   Continuous    Question Answer Comment  In the event of cardiac or respiratory ARREST Do not call a "code blue"   In the event of cardiac or respiratory ARREST Do not perform Intubation, CPR, defibrillation or ACLS   In the event of cardiac or respiratory ARREST Use medication by any route, position, wound care, and other measures to relive pain and suffering. May use oxygen, suction and manual treatment of airway obstruction as needed for comfort.      06/29/15 0445      TOTAL TIME TAKING CARE OF THIS PATIENT: 40 minutes.    Houa Ackert M.D on 06/30/2015 at 1:42 PM  Between 7am to 6pm - Pager - [phone] After 6pm go to [email or web address] - password [email or web address] [facility]  [facility]  Office  [phone]  CC: [facility] care physician; [facility]

## 2015-06-30 NOTE — Progress Notes (Signed)
Clinical Child psychotherapistocial Worker (CSW) contacted patient's son [name] and made him aware that PT is recommending short term rehab at a SNF. CSW explained SNF process and that patient[facility] will have to approve. [name] is agreeable to SNF search in [facility] [place] and will sign patient into SNF. Bed search has been initiated. CSW contacted [facility] case manger and made her aware of above. CSW will continue to follow and assist as needed.   Jetta LoutBailey Morgan, LCSWA [phone]

## 2015-06-30 NOTE — Discharge Planning (Signed)
IV and tele removed. DC papers, given, explained and educated with family in room.  VSS and RN assessment revealed stability for DC to facility.  Report called to Debbora DusAbby Wilson, [facility] at [facility]. Pt going to  Rm  C6, bed 2. Pt wheeled to  Car at front and son transporting via car to facility.

## 2015-07-01 LAB — URINE CULTURE

## 2015-07-05 LAB — CULTURE, BLOOD (ROUTINE X 2)
Culture: NO GROWTH
Culture: NO GROWTH

## 2015-12-25 ENCOUNTER — Emergency Department: Payer: Medicare HMO

## 2015-12-25 ENCOUNTER — Emergency Department
Admission: EM | Admit: 2015-12-25 | Discharge: 2015-12-25 | Disposition: A | Discharge status: Home / Self Care | Payer: Medicare HMO | Attending: Emergency Medicine | Admitting: Emergency Medicine

## 2015-12-25 DIAGNOSIS — S42212A Unspecified displaced fracture of surgical neck of left humerus, initial encounter for closed fracture: Secondary | ICD-10-CM | POA: Insufficient documentation

## 2015-12-25 DIAGNOSIS — Y92002 Bathroom of unspecified non-institutional (private) residence single-family (private) house as the place of occurrence of the external cause: Secondary | ICD-10-CM | POA: Insufficient documentation

## 2015-12-25 DIAGNOSIS — Y999 Unspecified external cause status: Secondary | ICD-10-CM | POA: Diagnosis not present

## 2015-12-25 DIAGNOSIS — N390 Urinary tract infection, site not specified: Secondary | ICD-10-CM | POA: Diagnosis not present

## 2015-12-25 DIAGNOSIS — W19XXXA Unspecified fall, initial encounter: Secondary | ICD-10-CM | POA: Insufficient documentation

## 2015-12-25 DIAGNOSIS — S42302A Unspecified fracture of shaft of humerus, left arm, initial encounter for closed fracture: Secondary | ICD-10-CM

## 2015-12-25 DIAGNOSIS — Y9301 Activity, walking, marching and hiking: Secondary | ICD-10-CM | POA: Insufficient documentation

## 2015-12-25 DIAGNOSIS — Z79899 Other long term (current) drug therapy: Secondary | ICD-10-CM | POA: Diagnosis not present

## 2015-12-25 DIAGNOSIS — I10 Essential (primary) hypertension: Secondary | ICD-10-CM | POA: Insufficient documentation

## 2015-12-25 DIAGNOSIS — S4992XA Unspecified injury of left shoulder and upper arm, initial encounter: Secondary | ICD-10-CM | POA: Diagnosis present

## 2015-12-25 HISTORY — DX: Unspecified dementia, unspecified severity, without behavioral disturbance, psychotic disturbance, mood disturbance, and anxiety: F03.90

## 2015-12-25 LAB — CBC WITH DIFFERENTIAL/PLATELET
BASOS ABS: 0.1 10*3/uL (ref 0–0.1)
Basophils Relative: 0 %
Eosinophils Absolute: 0.1 10*3/uL (ref 0–0.7)
Eosinophils Relative: 1 %
HEMATOCRIT: 35.3 % (ref 35.0–47.0)
Hemoglobin: 11.4 g/dL — ABNORMAL LOW (ref 12.0–16.0)
LYMPHS ABS: 1.1 10*3/uL (ref 1.0–3.6)
MCH: 25.7 pg — ABNORMAL LOW (ref 26.0–34.0)
MCHC: 32.2 g/dL (ref 32.0–36.0)
MCV: 80 fL (ref 80.0–100.0)
MONO ABS: 0.9 10*3/uL (ref 0.2–0.9)
Monocytes Relative: 7 %
NEUTROS ABS: 9.3 10*3/uL — AB (ref 1.4–6.5)
Platelets: 263 10*3/uL (ref 150–440)
RBC: 4.41 MIL/uL (ref 3.80–5.20)
RDW: 15.6 % — ABNORMAL HIGH (ref 11.5–14.5)
WBC: 11.4 10*3/uL — AB (ref 3.6–11.0)

## 2015-12-25 LAB — BASIC METABOLIC PANEL
ANION GAP: 7 (ref 5–15)
BUN: 21 mg/dL — ABNORMAL HIGH (ref 6–20)
CO2: 27 mmol/L (ref 22–32)
Calcium: 9 mg/dL (ref 8.9–10.3)
Chloride: 103 mmol/L (ref 101–111)
Creatinine, Ser: 0.88 mg/dL (ref 0.44–1.00)
GFR calc Af Amer: >60 mL/min (ref >60)
GFR calc non Af Amer: 54 mL/min — ABNORMAL LOW (ref >60)
GLUCOSE: 110 mg/dL — AB (ref 65–99)
POTASSIUM: 4 mmol/L (ref 3.5–5.1)
Sodium: 137 mmol/L (ref 135–145)

## 2015-12-25 LAB — URINALYSIS COMPLETE WITH MICROSCOPIC (ARMC ONLY)
BILIRUBIN URINE: NEGATIVE
GLUCOSE, UA: NEGATIVE mg/dL
KETONES UR: NEGATIVE mg/dL
Nitrite: NEGATIVE
PH: 6 (ref 5.0–8.0)
Protein, ur: NEGATIVE mg/dL
SPECIFIC GRAVITY, URINE: 1.006 (ref 1.005–1.030)

## 2015-12-25 MED ORDER — SODIUM CHLORIDE 0.9 % IV BOLUS (SEPSIS)
500.0000 mL | Freq: Once | INTRAVENOUS | Status: AC
  Administered 2015-12-25: 500 mL via INTRAVENOUS

## 2015-12-25 MED ORDER — ACETAMINOPHEN 325 MG PO TABS
650.0000 mg | ORAL_TABLET | Freq: Once | ORAL | Status: AC
  Administered 2015-12-25: 650 mg via ORAL
  Filled 2015-12-25: qty 2

## 2015-12-25 MED ORDER — CEFTRIAXONE SODIUM 1 G IJ SOLR
1.0000 g | INTRAMUSCULAR | Status: DC
  Administered 2015-12-25: 1 g via INTRAVENOUS
  Filled 2015-12-25: qty 10

## 2015-12-25 MED ORDER — CEPHALEXIN 500 MG PO CAPS: 500.0000 mg | ORAL_CAPSULE | Freq: Three times a day (TID) | ORAL | Status: AC

## 2015-12-25 NOTE — ED Notes (Signed)
EMS states pt fell, unwitnessed, walking to bathroom.  Pt was unable to get up and staff unable to assist patient from off floor.  Pt c/o L shoulder pain, with bump in area.  Pt had urinated on herself in the floor because she could not make it to bathroom in time.

## 2015-12-25 NOTE — Discharge Instructions (Signed)
1. Take antibiotic as prescribed (Keflex 500mg  three times daily x 7 days). 2. Take tylenol as needed for pain. 3. Wear sling while awake. 4. Return to the ER for worsening symptoms, persistent vomiting, fever, difficulty breathing or other concerns.  Fall Prevention in Hospitals, Adult As a hospital patient, your condition and the treatments you receive can increase your risk for falls. Some additional risk factors for falls in a hospital include:  Being in an unfamiliar environment.  Being on bed rest.  Your surgery.  Taking certain medicines.  Your tubing requirements, such as intravenous (IV) therapy or catheters. It is important that you learn how to decrease fall risks while at the hospital. Below are important tips that can help prevent falls. SAFETY TIPS FOR PREVENTING FALLS Talk about your risk of falling.  Ask your health care provider why you are at risk for falling. Is it your medicine, illness, tubing placement, or something else?  Make a plan with your health care provider to keep you safe from falls.  Ask your health care provider or pharmacist about side effects of your medicines. Some medicines can make you dizzy or affect your coordination. Ask for help.  Ask for help before getting out of bed. You may need to press your call button.  Ask for assistance in getting safely to the toilet.  Ask for a walker or cane to be put at your bedside. Ask that most of the side rails on your bed be placed up before your health care provider leaves the room.  Ask family or friends to sit with you.  Ask for things that are out of your reach, such as your glasses, hearing aids, telephone, bedside table, or call button. Follow these tips to avoid falling:  Stay lying or seated, rather than standing, while waiting for help.  Wear rubber-soled slippers or shoes whenever you walk in the hospital.  Avoid quick, sudden movements.  Change positions slowly.  Sit on the side of  your bed before standing.  Stand up slowly and wait before you start to walk.  Let your health care provider know if there is a spill on the floor.  Pay careful attention to the medical equipment, electrical cords, and tubes around you.  When you need help, use your call button by your bed or in the bathroom. Wait for one of your health care providers to help you.  If you feel dizzy or unsure of your footing, return to bed and wait for assistance.  Avoid being distracted by the TV, telephone, or another person in your room.  Do not lean or support yourself on rolling objects, such as IV poles or bedside tables.   This information is not intended to replace advice given to you by your health care provider. Make sure you discuss any questions you have with your health care provider.   Document Released: 07/16/2000 Document Revised: 08/09/2014 Document Reviewed: 03/26/2012 Elsevier Interactive Patient Education 2016 [employer]  Humerus Fracture Treated With Immobilization The humerus is the large bone in the upper arm. A broken (fractured) humerus is often treated by wearing a cast, splint, or sling (immobilization). This holds the broken pieces in place so they can heal.  HOME CARE  Put ice on the injured area.  Put ice in a plastic bag.  Place a towel between your skin and the bag.  Leave the ice on for 15-20 minutes, 03-04 times a day.  If you are given a cast:  Do not  scratch the skin under the cast.  Check the skin around the cast every day. You may put lotion on any red or sore areas.  Keep the cast dry and clean.  If you are given a splint:  Wear the splint as told.  Keep the splint clean and dry.  Loosen the elastic around the splint if your fingers become numb, cold, tingle, or turn blue.  If you are given a sling:  Wear the sling as told.  Do not put pressure on any part of the cast or splint until it is fully hardened.  The cast or splint must be  protected with a plastic bag during bathing. Do not lower the cast or splint into water.  Only take medicine as told by your doctor.  Do exercises as told by your doctor.  Follow up as told by your doctor. GET HELP RIGHT AWAY IF:   Your skin or fingernails turn blue or gray.  Your arm feels cold or numb.  You have very bad pain in the injured arm.  You are having problems with the medicines you were given. MAKE SURE YOU:   Understand these instructions.  Will watch your condition.  Will get help right away if you are not doing well or get worse.   This information is not intended to replace advice given to you by your health care provider. Make sure you discuss any questions you have with your health care provider.   Document Released: 01/05/2008 Document Revised: 08/09/2014 Document Reviewed: 12/11/2014 Elsevier Interactive Patient Education 2016 [employer]  Urinary Tract Infection A urinary tract infection (UTI) can occur any place along the urinary tract. The tract includes the kidneys, ureters, bladder, and urethra. A type of germ called bacteria often causes a UTI. UTIs are often helped with antibiotic medicine.  HOME CARE   If given, take antibiotics as told by your doctor. Finish them even if you start to feel better.  Drink enough fluids to keep your pee (urine) clear or pale yellow.  Avoid tea, drinks with caffeine, and bubbly (carbonated) drinks.  Pee often. Avoid holding your pee in for a long time.  Pee before and after having sex (intercourse).  Wipe from front to back after you poop (bowel movement) if you are a woman. Use each tissue only once. GET HELP RIGHT AWAY IF:   You have back pain.  You have lower belly (abdominal) pain.  You have chills.  You feel sick to your stomach (nauseous).  You throw up (vomit).  Your burning or discomfort with peeing does not go away.  You have a fever.  Your symptoms are not better in 3 days. MAKE SURE  YOU:   Understand these instructions.  Will watch your condition.  Will get help right away if you are not doing well or get worse.   This information is not intended to replace advice given to you by your health care provider. Make sure you discuss any questions you have with your health care provider.   Document Released: 01/05/2008 Document Revised: 08/09/2014 Document Reviewed: 02/17/2012 Elsevier Interactive Patient Education [employer]

## 2015-12-25 NOTE — ED Provider Notes (Signed)
[facility] Emergency Department Provider Note   ____________________________________________  Time seen: Approximately 4:51 AM  I have reviewed the triage vital signs and the nursing notes.   HISTORY  Chief Complaint Fall    HPI Debra Peck is a 82 y.o. female who presents to the ED from nursing facility via EMS with a chief complaint of unwitnessed fall with left shoulder pain. Per staff, patient ambulated to the restroom; she was found down after unwitnessed fall. Patient denies striking head or LOC. States she urinated on herself because she cannot get to the bathroom on time. Complains of left shoulder pain. Denies headache, neck pain, vision changes, fever, chills, chest pain, shortness of breath, abdominal pain, nausea, vomiting, diarrhea. Nothing makes her pain better. Movement makes her pain worse.   Past Medical History  Diagnosis Date  . HTN (hypertension)   . Anxiety   . Dementia     Patient Active Problem List   Diagnosis Date Noted  . Acute encephalopathy 06/29/2015  . HTN (hypertension) 06/29/2015  . Anxiety 06/29/2015  . Falls 06/29/2015    History reviewed. No pertinent past surgical history.  Current Outpatient Rx  Name  Route  Sig  Dispense  Refill  . amLODipine (NORVASC) 2.5 MG tablet   Oral   Take 2.5 mg by mouth daily.         . cephALEXin (KEFLEX) 500 MG capsule   Oral   Take 1 capsule (500 mg total) by mouth 3 (three) times daily.   21 capsule   0   . PARoxetine (PAXIL) 10 MG tablet   Oral   Take 1 tablet (10 mg total) by mouth daily.   30 tablet   0   . PARoxetine (PAXIL) 20 MG tablet   Oral   Take 20 mg by mouth at bedtime.           Allergies Review of patient's allergies indicates no known allergies.  No family history on file.  Social History Social History  Substance Use Topics  . Smoking status: Never Smoker   . Smokeless tobacco: None  . Alcohol Use: No    Review of  Systems  Constitutional: No fever/chills. Eyes: No visual changes. ENT: No sore throat. Cardiovascular: Denies chest pain. Respiratory: Denies shortness of breath. Gastrointestinal: No abdominal pain.  No nausea, no vomiting.  No diarrhea.  No constipation. Genitourinary: Negative for dysuria. Musculoskeletal: Positive for left shoulder pain. Negative for back pain. Skin: Negative for rash. Neurological: Negative for headaches, focal weakness or numbness.  10-point ROS otherwise negative.  ____________________________________________   PHYSICAL EXAM:  VITAL SIGNS: ED Triage Vitals  Enc Vitals Group     BP --      Pulse --      Resp --      Temp --      Temp src --      SpO2 --      Weight --      Height --      Head Cir --      Peak Flow --      Pain Score 12/25/15 0439 10     Pain Loc --      Pain Edu? --      Excl. in GC? --     Constitutional: Alert and oriented. Well appearing and in no acute distress. Hard of hearing. Eyes: Conjunctivae are normal. PERRL. EOMI. Arcus senilis. Head: Atraumatic. Nose: No congestion/rhinnorhea. Mouth/Throat: Mucous membranes are moist.  Oropharynx non-erythematous. Neck: No stridor.  No cervical spine tenderness to palpation. No step-offs or deformities. Cardiovascular: Normal rate, regular rhythm. Grossly normal heart sounds.  Good peripheral circulation. Respiratory: Normal respiratory effort.  No retractions. Lungs CTAB. Gastrointestinal: Soft and nontender. No distention. No abdominal bruits. No CVA tenderness. Musculoskeletal: Left shoulder tender to palpation with mild-to-moderate swelling. Limited range of motion secondary to pain. 2+ radial pulses. His, less than 5 second capillary refill. No spinal tenderness to palpation. No step-offs or deformities noted. Pelvis is stable and nontender. Full range of motion bilateral hips. No lower extremity tenderness nor edema.  No joint effusions. Neurologic:  Normal speech and  language. No gross focal neurologic deficits are appreciated. No gait instability. Skin:  Skin is warm, dry and intact. No rash noted. Psychiatric: Mood and affect are normal. Speech and behavior are normal.  ____________________________________________   LABS (all labs ordered are listed, but only abnormal results are displayed)  Labs Reviewed  URINALYSIS COMPLETEWITH MICROSCOPIC ([facility] ONLY) - Abnormal; Notable for the following:    Color, Urine YELLOW (*)    APPearance CLEAR (*)    Hgb urine dipstick 1+ (*)    Leukocytes, UA 3+ (*)    Bacteria, UA RARE (*)    Squamous Epithelial / LPF 0-5 (*)    All other components within normal limits  CULTURE, BLOOD (ROUTINE X 2)  CULTURE, BLOOD (ROUTINE X 2)  URINE CULTURE  CBC WITH DIFFERENTIAL/PLATELET  BASIC METABOLIC PANEL   ____________________________________________  EKG  ED ECG REPORT I, Geddy Boydstun J, the attending physician, personally viewed and interpreted this ECG.   Date: 12/25/2015  EKG Time: 0441  Rate: 72  Rhythm: normal EKG, normal sinus rhythm  Axis: LAD  Intervals:left bundle branch block  ST&T Change: Nonspecific  ____________________________________________  RADIOLOGY  Left shoulder xrays (viewed by me, interpreted per Dr. Karie Kirks): Acute displaced LEFT humerus surgical neck fracture. No dislocation.  Pelvis xrays (viewed by me, interpreted per Dr. Karie Kirks): Negative. ____________________________________________   PROCEDURES  Procedure(s) performed: None  Critical Care performed: No  ____________________________________________   INITIAL IMPRESSION / ASSESSMENT AND PLAN / ED COURSE  Pertinent labs & imaging results that were available during my care of the patient were reviewed by me and considered in my medical decision making (see chart for details).  82 year old female who presents after unwitnessed fall from nursing home. We'll obtain x-ray images of left shoulder as well as  pelvis.  ----------------------------------------- 6:18 AM on 12/25/2015 -----------------------------------------  Updated patient and son of urine and xray results. Will obtain basic lab work, infuse IV antibiotic for UTI. Will place patient in sling and have her follow-up with orthopedics next week. If lab work is unremarkable, anticipate discharge back to nursing facility on antibiotic for UTI.   ----------------------------------------- 7:23 AM on 12/25/2015 -----------------------------------------  Updated patient and son of laboratory results. IV Rocephin completed. Will discharge back to nursing facility on oral Keflex and close follow-up with her PCP. Strict return precautions given. Son verbalizes understanding and agrees with plan of care. ____________________________________________   FINAL CLINICAL IMPRESSION(S) / ED DIAGNOSES  Final diagnoses:  Fall, initial encounter  UTI (lower urinary tract infection)  Humerus fracture, left, closed, initial encounter      NEW MEDICATIONS STARTED DURING THIS VISIT:  New Prescriptions   CEPHALEXIN (KEFLEX) 500 MG CAPSULE    Take 1 capsule (500 mg total) by mouth 3 (three) times daily.     Note:  This document was prepared using Conservation officer, historic buildings  and may include unintentional dictation errors.    Irean Hong, MD 12/25/15 [phone]

## 2015-12-25 NOTE — ED Notes (Signed)
Pt's son verbalized understanding of discharge instructions. NAD at this time. 

## 2015-12-27 LAB — URINE CULTURE: SPECIAL REQUESTS: NORMAL

## 2015-12-28 NOTE — Progress Notes (Signed)
ED Antimicrobial Stewardship Positive Culture Follow Up   Debra Peck is an 82 y.o. female who [facility] with a chief complaint of  Chief Complaint  Patient presents with  . Fall    Recent Results (from the past 720 hour(s))  Urine culture     Status: Abnormal   Collection Time: 12/25/15  5:45 AM  Result Value Ref Range Status   Specimen Description URINE, RANDOM  Final   Special Requests Normal  Final   Culture >=100,000 COLONIES/mL PSEUDOMONAS AERUGINOSA (A)  Final   Report Status 12/27/2015 FINAL  Final   Organism ID, Bacteria PSEUDOMONAS AERUGINOSA (A)  Final      Susceptibility   Pseudomonas aeruginosa - MIC*    CEFTAZIDIME 4 SENSITIVE Sensitive     CIPROFLOXACIN <=0.25 SENSITIVE Sensitive     GENTAMICIN <=1 SENSITIVE Sensitive     IMIPENEM <=0.25 SENSITIVE Sensitive     PIP/TAZO 8 SENSITIVE Sensitive     CEFEPIME 2 SENSITIVE Sensitive     * >=100,000 COLONIES/mL PSEUDOMONAS AERUGINOSA  Culture, blood (routine x 2)     Status: None (Preliminary result)   Collection Time: 12/25/15  6:23 AM  Result Value Ref Range Status   Specimen Description BLOOD BLOOD RIGHT FOREARM  Final   Special Requests BOTTLES DRAWN AEROBIC AND ANAEROBIC 5ML  Final   Culture NO GROWTH 3 DAYS  Final   Report Status PENDING  Incomplete  Culture, blood (routine x 2)     Status: None (Preliminary result)   Collection Time: 12/25/15  6:30 AM  Result Value Ref Range Status   Specimen Description BLOOD LEFT HAND  Final   Special Requests BOTTLES DRAWN AEROBIC AND ANAEROBIC 5ML  Final   Culture NO GROWTH 3 DAYS  Final   Report Status PENDING  Incomplete    [x]  Treated with cephalexin, organism resistant to prescribed antimicrobial  New antibiotic prescription: Patient resides at nursing facility [name]  Contacted [facility] and received fax number [phone]).  RN for patient will forward to provider.  Recommended transitioning patient to ciprofloxacin 250 mg po  BID x 10 days.    Even Budlong G 12/28/2015, 1:22 PM

## 2015-12-30 LAB — CULTURE, BLOOD (ROUTINE X 2)
CULTURE: NO GROWTH
Culture: NO GROWTH

## 2016-10-19 IMAGING — CR DG PELVIS 1-2V
1 series · 1 of 1 positions shown · non-contrast
Comparison: None.

CLINICAL DATA: Fell tonight while walking to bathroom. Pelvic pain.

EXAM:
PELVIS - 1-2 VIEW

[dg pelvis 1-2 views]
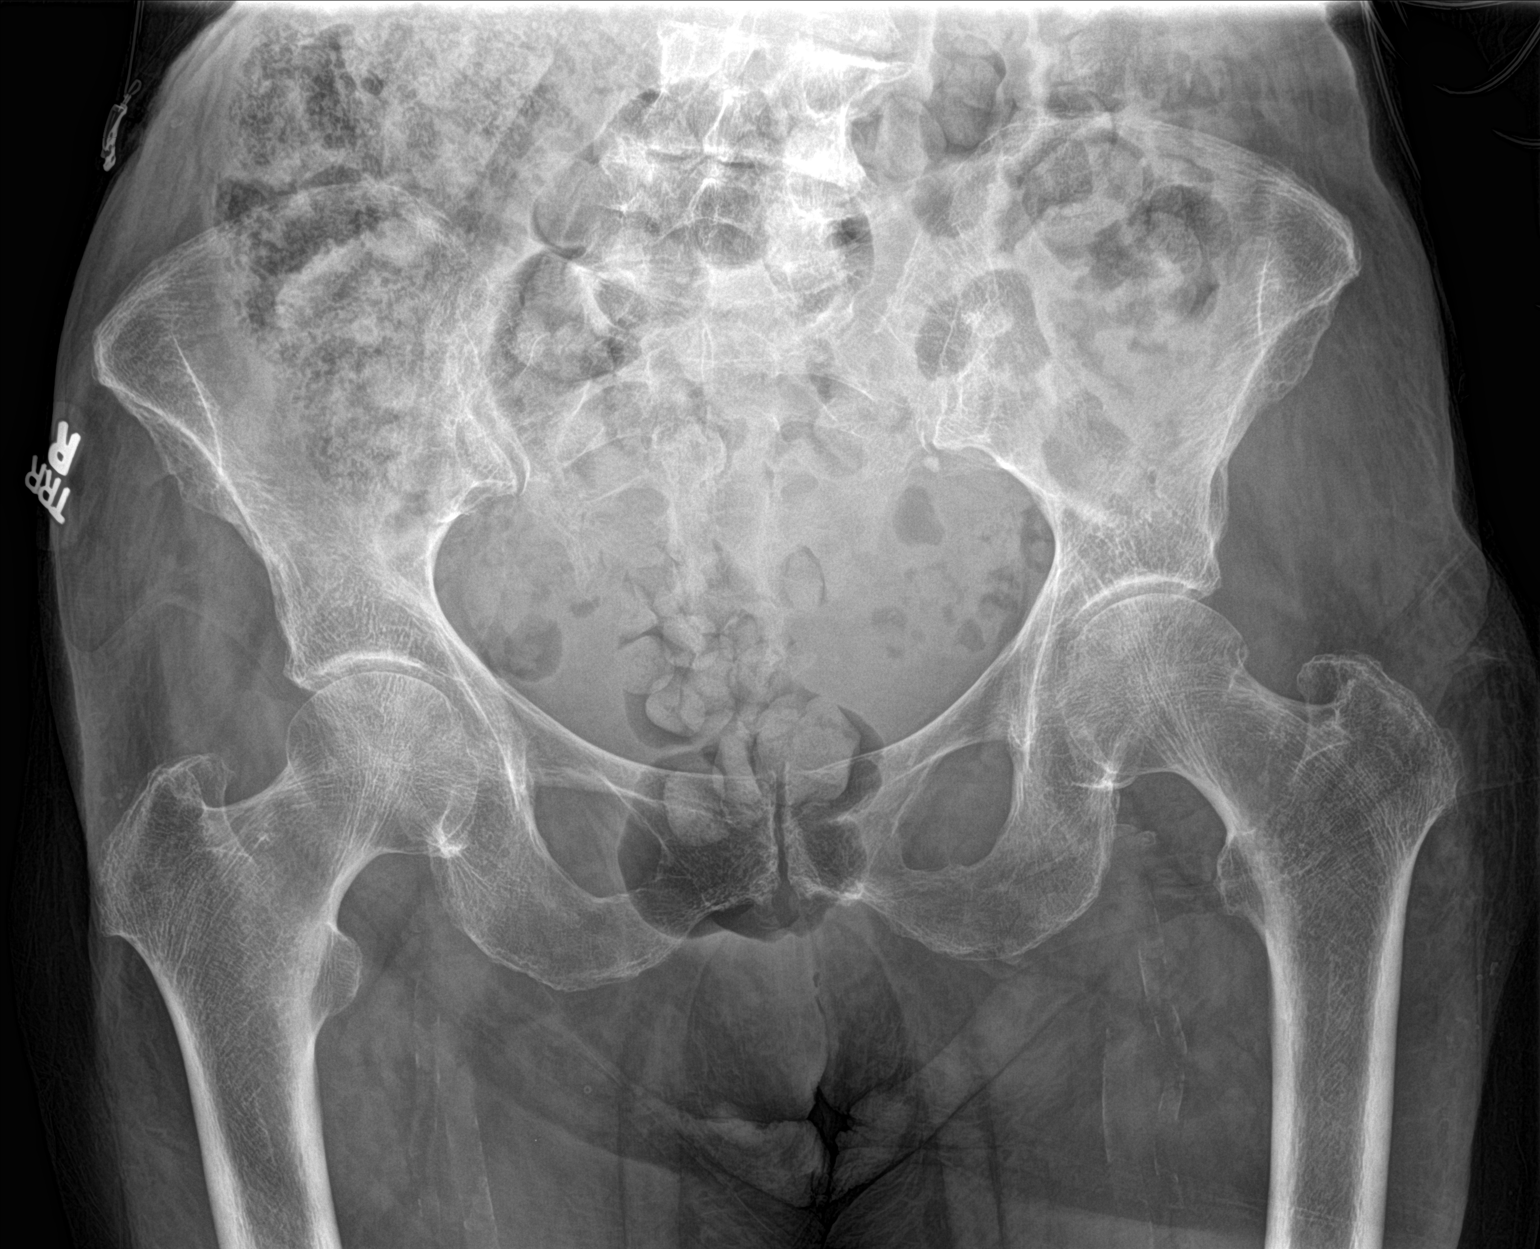

[1 of 1 positions shown; findings below may reference images not displayed]

FINDINGS: There is no evidence of pelvic fracture or diastasis. No pelvic bone
lesions are seen. Osteopenia. Moderate vascular calcifications.
IMPRESSION: Negative.

## 2019-12-01 DEATH — deceased
